# Patient Record
Sex: Female | Born: 1995 | Hispanic: Yes | Marital: Single | Smoking: Never smoker
Health system: Southern US, Community
[De-identification: ages and names within clinical notes are randomized; demographics above are authoritative.]

## PROBLEM LIST (undated history)

## (undated) DIAGNOSIS — F99 Mental disorder, not otherwise specified: Secondary | ICD-10-CM

## (undated) DIAGNOSIS — N946 Dysmenorrhea, unspecified: Secondary | ICD-10-CM

## (undated) DIAGNOSIS — F419 Anxiety disorder, unspecified: Secondary | ICD-10-CM

## (undated) DIAGNOSIS — Z30017 Encounter for initial prescription of implantable subdermal contraceptive: Secondary | ICD-10-CM

## (undated) HISTORY — PX: NO PAST SURGERIES: SHX2092

## (undated) HISTORY — DX: Anxiety disorder, unspecified: F41.9

## (undated) HISTORY — DX: Encounter for initial prescription of implantable subdermal contraceptive: Z30.017

## (undated) HISTORY — DX: Mental disorder, not otherwise specified: F99

---
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
## 2016-08-15 IMAGING — US US OB TRANSVAGINAL
1 series · 14 of 28 positions shown · non-contrast
Comparison: None.

CLINICAL DATA: Pelvic pain, positive pregnancy test

EXAM:
OBSTETRIC <14 WK US AND TRANSVAGINAL OB US
TECHNIQUE: Both transabdominal and transvaginal ultrasound examinations were
performed for complete evaluation of the gestation as well as the
maternal uterus, adnexal regions, and pelvic cul-de-sac.
Transvaginal technique was performed to assess early pregnancy.

[Series 1: us ob transvaginal · 0.18mm/px · 14 of 54 slices shown]
[im 2/54]
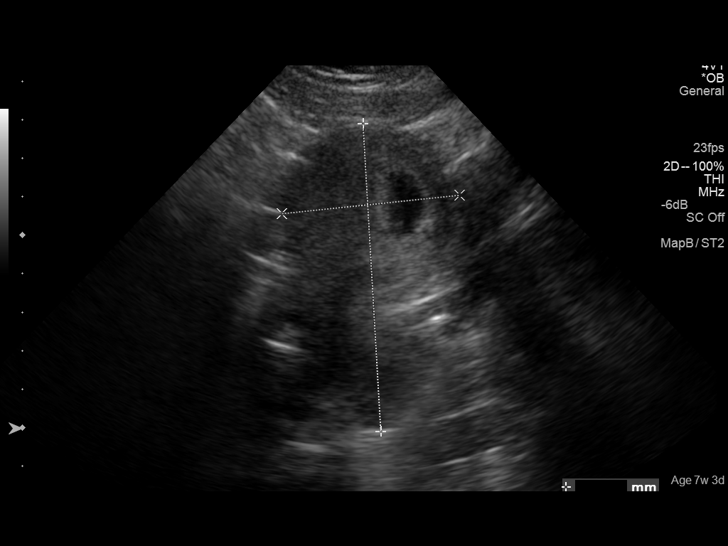
[im 6/54]
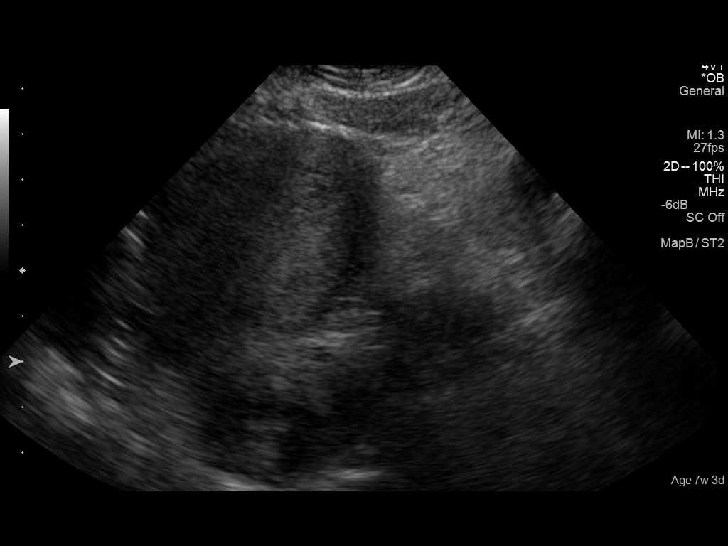
[im 10/54]
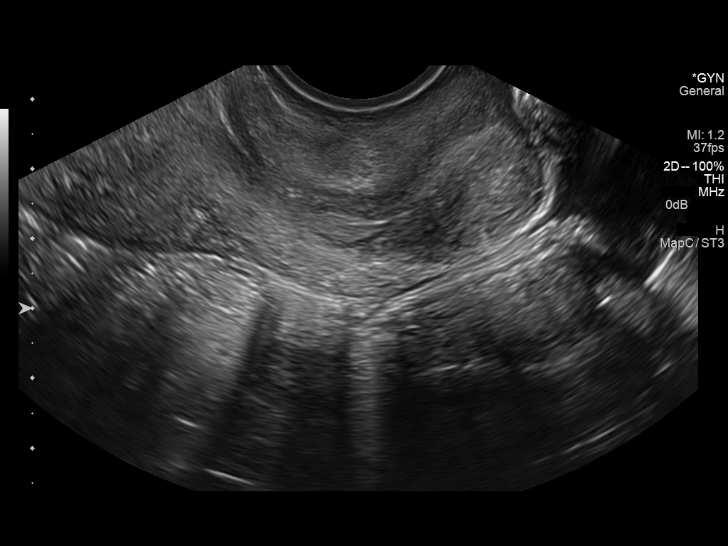
[im 14/54]
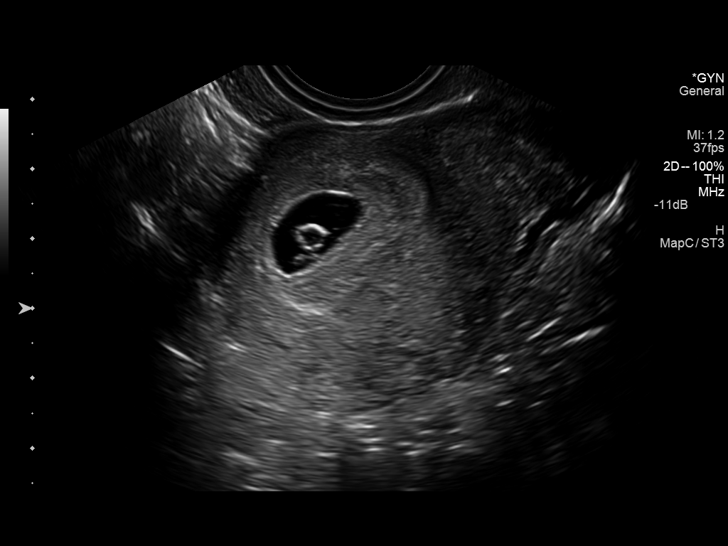
[im 18/54]
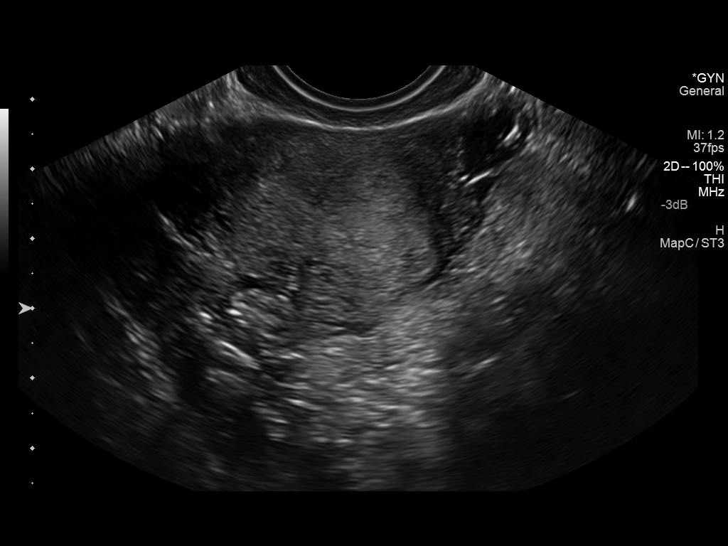
[im 22/54]
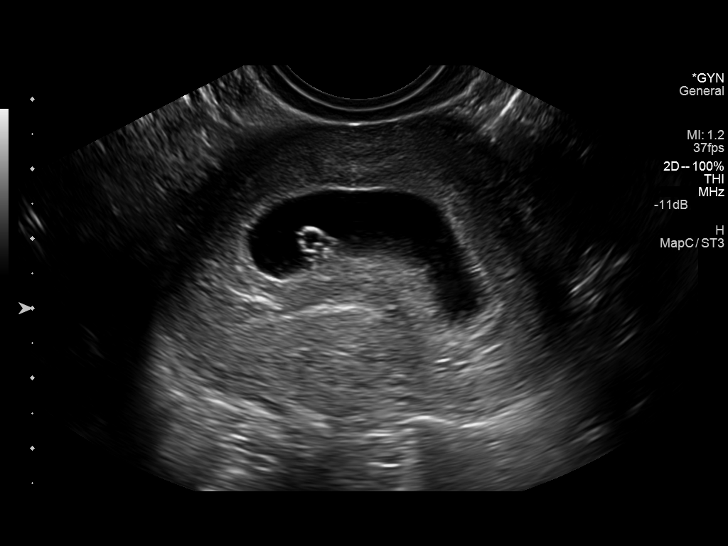
[im 26/54]
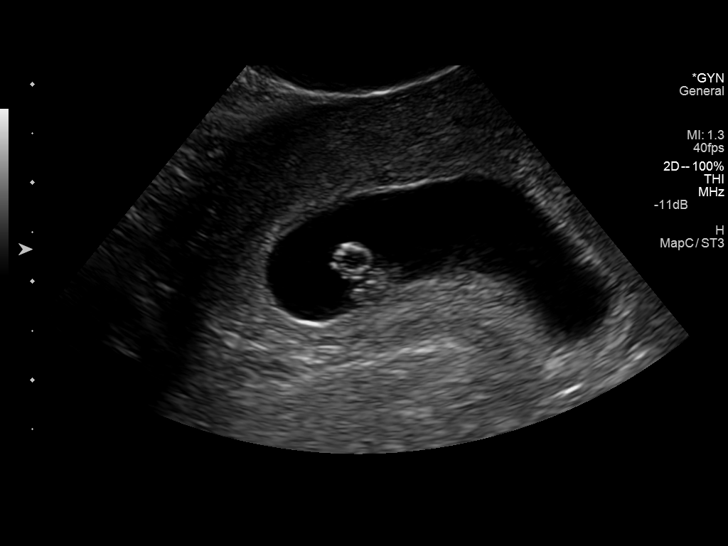
[im 30/54]
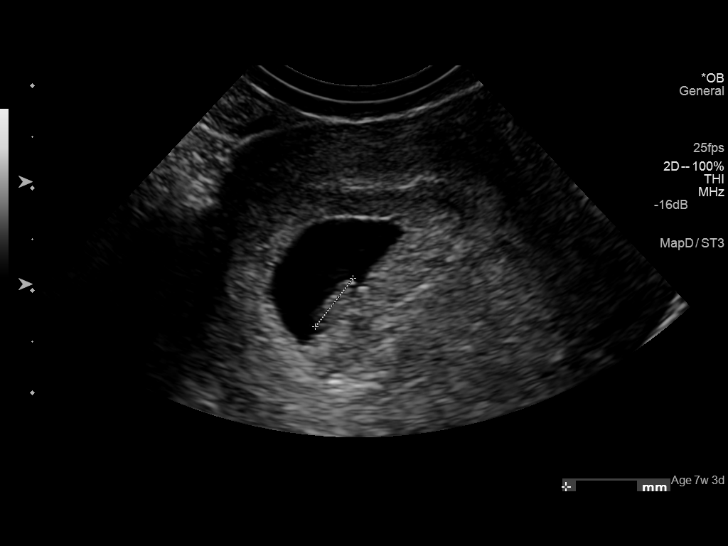
[im 34/54]
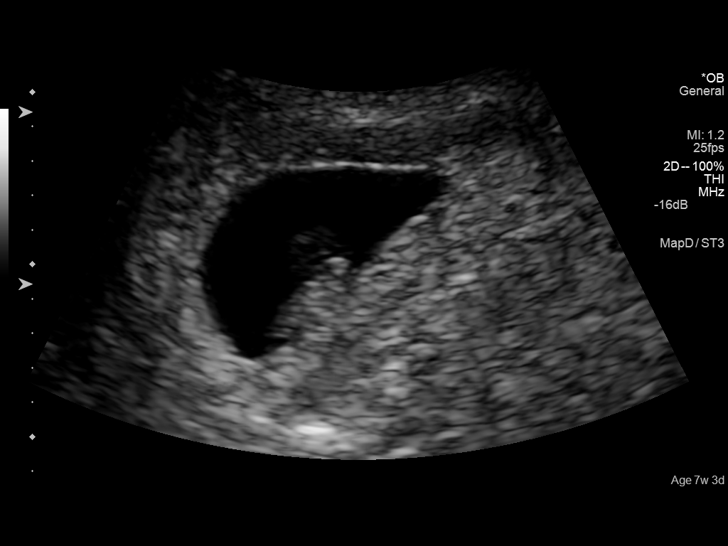
[im 38/54]
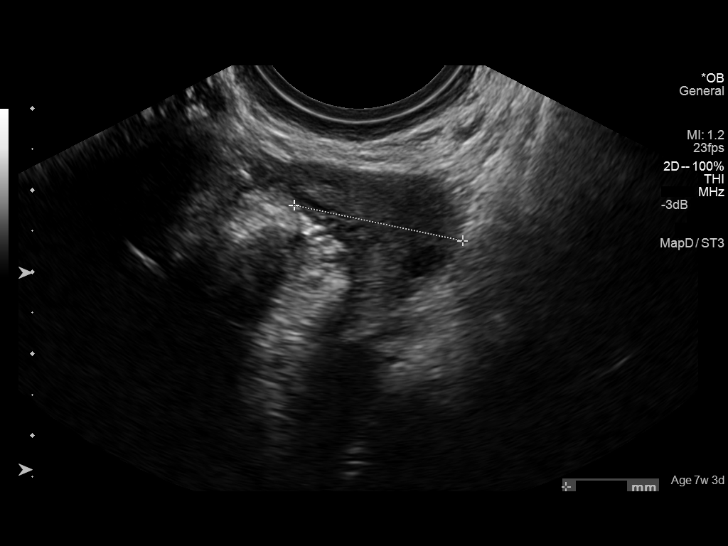
[im 42/54]
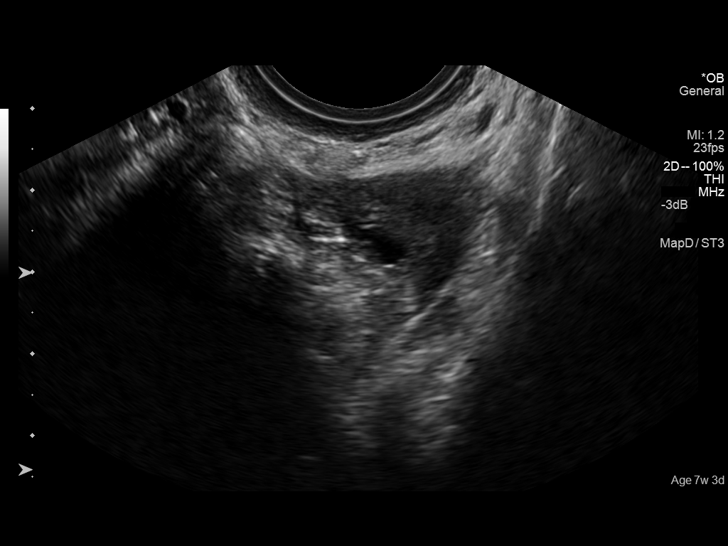
[im 46/54]
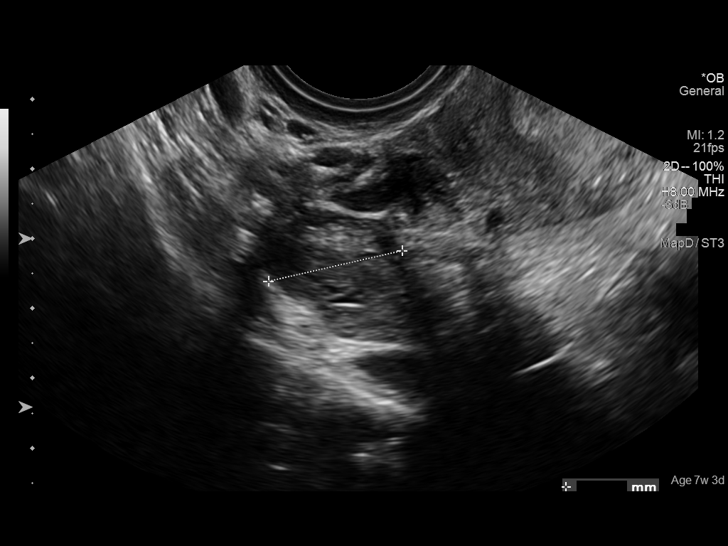
[im 50/54]
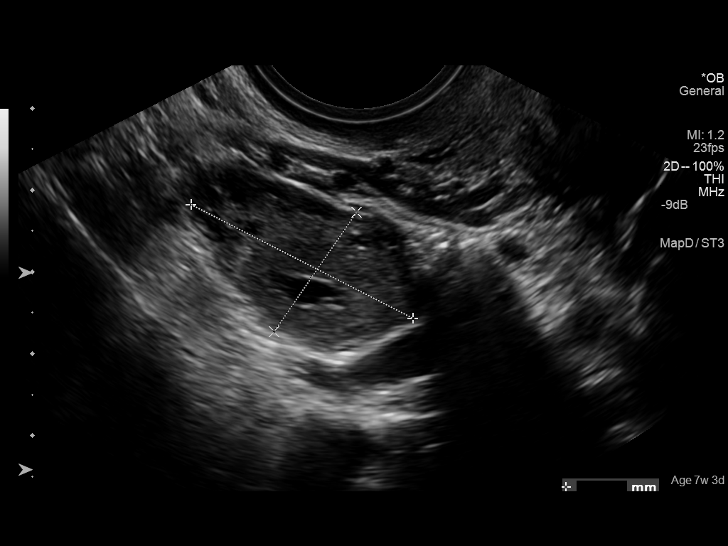
[im 54/54]
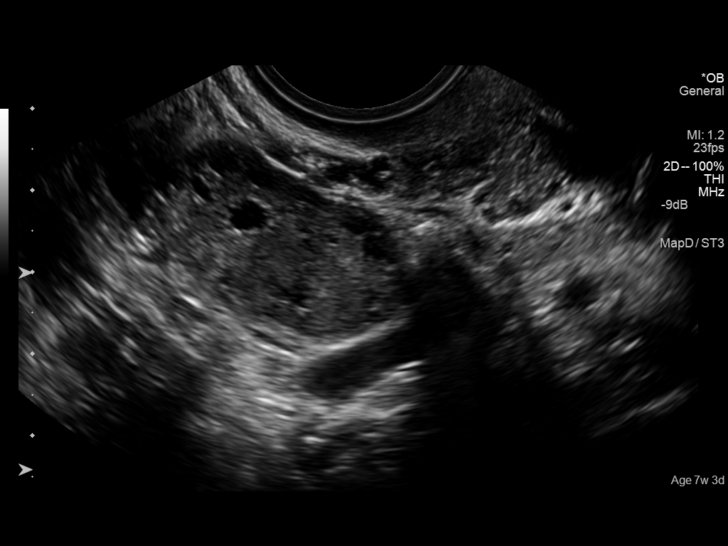

[14 of 28 positions shown; findings below may reference images not displayed]

FINDINGS: Intrauterine gestational sac: Visualized/normal in shape.

Yolk sac:  Present

Embryo:  Present

Cardiac Activity: Present

Heart Rate:  153 bpm

CRL:   5.9 mm  mm   6 w 3 d                  US EDC: 03/25/2015

Maternal uterus/adnexae: Small area of decreased echogenicity is
noted adjacent to the gestational sac consistent with a small
subchorionic hemorrhage. The ovaries are within normal limits.
IMPRESSION: Single live intrauterine gestation at 6 weeks 3 days, small
subchorionic hemorrhage. No other focal abnormality is noted.

## 2016-11-14 ENCOUNTER — Encounter: Payer: Self-pay | Admitting: Nurse Practitioner

## 2016-11-14 ENCOUNTER — Ambulatory Visit (INDEPENDENT_AMBULATORY_CARE_PROVIDER_SITE_OTHER): Payer: Medicaid Other | Admitting: Nurse Practitioner

## 2016-11-14 VITALS — BP 97/65 | HR 71 | Temp 98.0°F | Ht 60.0 in | Wt 141.0 lb

## 2016-11-14 DIAGNOSIS — F331 Major depressive disorder, recurrent, moderate: Secondary | ICD-10-CM | POA: Diagnosis not present

## 2016-11-14 DIAGNOSIS — Z0000 Encounter for general adult medical examination without abnormal findings: Secondary | ICD-10-CM | POA: Diagnosis not present

## 2016-11-14 MED ORDER — ESCITALOPRAM OXALATE 10 MG PO TABS
10.0000 mg | ORAL_TABLET | Freq: Every day | ORAL | 5 refills | Status: DC
Start: 2016-11-14 — End: 2017-06-22

## 2016-11-14 NOTE — Patient Instructions (Signed)

## 2016-11-14 NOTE — Progress Notes (Signed)
   Subjective:    Patient ID: Darlene Adkins, female    DOB: 1995/12/29, 21 y.o.   MRN: [id number]  HPI Patient comes in today for annual physical exam without pap. She is doing well. Is sexually active. Has implanon in arm to prevent pregnency. Only practices safe se sometimes. SHe has no medical problems and is on no meds. She does voice depression. Says she has some anxiety which is better. Depression screen PHQ 2/9 11/14/2016  Decreased Interest 3  Down, Depressed, Hopeless 1  PHQ - 2 Score 4  Altered sleeping 0  Tired, decreased energy 3  Change in appetite 3  Feeling bad or failure about yourself  1  Trouble concentrating 1  Moving slowly or fidgety/restless 0  Suicidal thoughts 0  PHQ-9 Score 12      Review of Systems  Constitutional: Positive for appetite change. Negative for diaphoresis.  Eyes: Negative for pain.  Respiratory: Negative for shortness of breath.   Cardiovascular: Negative for chest pain, palpitations and leg swelling.  Gastrointestinal: Negative for abdominal pain.  Endocrine: Negative for polydipsia.  Skin: Negative for rash.  Neurological: Negative for dizziness, weakness and headaches.  Hematological: Does not bruise/bleed easily.  All other systems reviewed and are negative.      Objective:   Physical Exam  Constitutional: She is oriented to person, place, and time. She appears well-developed and well-nourished.  HENT:  Head: Normocephalic.  Right Ear: Hearing, tympanic membrane, external ear and ear canal normal.  Left Ear: Hearing, tympanic membrane, external ear and ear canal normal.  Nose: Nose normal.  Mouth/Throat: Uvula is midline, oropharynx is clear and moist and mucous membranes are normal.  Eyes: Conjunctivae and EOM are normal. Pupils are equal, round, and reactive to light.  Neck: Normal range of motion. Neck supple. No JVD present. No thyromegaly present.  Cardiovascular: Normal rate, normal heart sounds and intact distal pulses.     No murmur heard. Pulmonary/Chest: Effort normal and breath sounds normal. She has no wheezes. She has no rales.  Abdominal: Soft. Bowel sounds are normal. She exhibits no mass.  Musculoskeletal: Normal range of motion.  Neurological: She is alert and oriented to person, place, and time. She has normal reflexes.  Skin: Skin is warm and dry.  Psychiatric: She has a normal mood and affect. Her behavior is normal. Judgment and thought content normal.   BP 97/65   Pulse 71   Temp 98 F (36.7 C) (Oral)   Ht 5' (1.524 m)   Wt 141 lb (64 kg)   BMI 27.54 kg/m       Assessment & Plan:  1. Annual physical exam Diet and exercise Will need  PAP when turn 22 - CMP14+EGFR - Lipid panel - Thyroid Panel With TSH - CBC with Differential/Platelet  2. Moderate episode of recurrent major depressive disorder [facility]) Stress management Call if have issues with lexapro- side effects discussed - escitalopram (LEXAPRO) 10 MG tablet; Take 1 tablet (10 mg total) by mouth daily.  Dispense: 30 tablet; Refill: [place], FNP

## 2016-11-15 LAB — CMP14+EGFR
A/G RATIO: 1.4 (ref 1.2–2.2)
ALK PHOS: 112 IU/L (ref 39–117)
ALT: 13 IU/L (ref 0–32)
AST: 19 IU/L (ref 0–40)
Albumin: 4 g/dL (ref 3.5–5.5)
BILIRUBIN TOTAL: 0.2 mg/dL (ref 0.0–1.2)
BUN/Creatinine Ratio: 14 (ref 9–23)
BUN: 9 mg/dL (ref 6–20)
CHLORIDE: 102 mmol/L (ref 96–106)
CO2: 22 mmol/L (ref 18–29)
Calcium: 9.4 mg/dL (ref 8.7–10.2)
Creatinine, Ser: 0.63 mg/dL (ref 0.57–1.00)
GFR calc Af Amer: 149 mL/min/{1.73_m2} (ref >59)
GFR calc non Af Amer: 130 mL/min/{1.73_m2} (ref >59)
GLUCOSE: 88 mg/dL (ref 65–99)
Globulin, Total: 2.9 g/dL (ref 1.5–4.5)
POTASSIUM: 4.6 mmol/L (ref 3.5–5.2)
Sodium: 139 mmol/L (ref 134–144)
Total Protein: 6.9 g/dL (ref 6.0–8.5)

## 2016-11-15 LAB — CBC WITH DIFFERENTIAL/PLATELET
BASOS ABS: 0 10*3/uL (ref 0.0–0.2)
Basos: 0 %
EOS (ABSOLUTE): 0.1 10*3/uL (ref 0.0–0.4)
Eos: 1 %
HEMOGLOBIN: 11.5 g/dL (ref 11.1–15.9)
Hematocrit: 35.6 % (ref 34.0–46.6)
IMMATURE GRANS (ABS): 0 10*3/uL (ref 0.0–0.1)
IMMATURE GRANULOCYTES: 0 %
LYMPHS: 29 %
Lymphocytes Absolute: 2.1 10*3/uL (ref 0.7–3.1)
MCH: 26.4 pg — AB (ref 26.6–33.0)
MCHC: 32.3 g/dL (ref 31.5–35.7)
MCV: 82 fL (ref 79–97)
MONOCYTES: 5 %
Monocytes Absolute: 0.4 10*3/uL (ref 0.1–0.9)
NEUTROS ABS: 4.6 10*3/uL (ref 1.4–7.0)
Neutrophils: 65 %
Platelets: 265 10*3/uL (ref 150–379)
RBC: 4.35 x10E6/uL (ref 3.77–5.28)
RDW: 15 % (ref 12.3–15.4)
WBC: 7.2 10*3/uL (ref 3.4–10.8)

## 2016-11-15 LAB — THYROID PANEL WITH TSH
Free Thyroxine Index: 2 (ref 1.2–4.9)
T3 UPTAKE RATIO: 25 % (ref 24–39)
T4, Total: 7.9 ug/dL (ref 4.5–12.0)
TSH: 1.29 u[IU]/mL (ref 0.450–4.500)

## 2016-11-15 LAB — LIPID PANEL
Chol/HDL Ratio: 3.7 ratio (ref 0.0–4.4)
Cholesterol, Total: 148 mg/dL (ref 100–199)
HDL: 40 mg/dL (ref >39)
LDL Calculated: 88 mg/dL (ref 0–99)
TRIGLYCERIDES: 101 mg/dL (ref 0–149)
VLDL CHOLESTEROL CAL: 20 mg/dL (ref 5–40)

## 2017-05-31 DIAGNOSIS — Z23 Encounter for immunization: Secondary | ICD-10-CM | POA: Diagnosis not present

## 2017-06-22 ENCOUNTER — Encounter: Payer: Self-pay | Admitting: Nurse Practitioner

## 2017-06-22 ENCOUNTER — Ambulatory Visit: Payer: Managed Care, Other (non HMO) | Admitting: Nurse Practitioner

## 2017-06-22 VITALS — BP 95/65 | HR 65 | Temp 97.9°F | Ht 60.0 in | Wt 133.0 lb

## 2017-06-22 DIAGNOSIS — F320 Major depressive disorder, single episode, mild: Secondary | ICD-10-CM

## 2017-06-22 MED ORDER — SERTRALINE HCL 50 MG PO TABS: 50.0000 mg | ORAL_TABLET | Freq: Every day | ORAL | 5 refills | Status: DC

## 2017-06-22 NOTE — Progress Notes (Signed)
   Subjective:    Patient ID: Darlene Adkins, female    DOB: 09/16/1995, 21 y.o.   MRN: [id number]  HPI  Patient comes in today to discuss depression. She was seen 11/14/16 with depression and was started on lexapro. She only took it for a month but she says it did not seem to make a difference. She says she gets irritated very easily, loss of appetite, trouble bonding with baby. Her sister is bonding better with child more then she is. She denies suicidal thoughts.  [facility] 06/22/2017 11/14/2016  Decreased Interest 2 3  Down, Depressed, Hopeless 3 1  PHQ - 2 Score 5 4  Altered sleeping 1 0  Tired, decreased energy 2 3  Change in appetite 3 3  Feeling bad or failure about yourself  2 1  Trouble concentrating 2 1  Moving slowly or fidgety/restless 1 0  Suicidal thoughts 0 0  PHQ-9 Score 16 12      Review of Systems  Constitutional: Negative for activity change and appetite change.  HENT: Negative.   Eyes: Negative for pain.  Respiratory: Negative for shortness of breath.   Cardiovascular: Negative for chest pain, palpitations and leg swelling.  Gastrointestinal: Negative for abdominal pain.  Endocrine: Negative for polydipsia.  Genitourinary: Negative.   Skin: Negative for rash.  Neurological: Negative for dizziness, weakness and headaches.  Hematological: Does not bruise/bleed easily.  Psychiatric/Behavioral: Negative.   All other systems reviewed and are negative.      Objective:   Physical Exam  Constitutional: She is oriented to person, place, and time. She appears well-developed and well-nourished. No distress.  Cardiovascular: Normal rate and regular rhythm.  Pulmonary/Chest: Effort normal and breath sounds normal.  Neurological: She is alert and oriented to person, place, and time.  Skin: Skin is warm and dry.  Psychiatric:  Good eye contact Answers all questions appropriatey   BP 95/65   Pulse 65   Temp 97.9 F (36.6 C) (Oral)   Ht 5' (1.524  m)   Wt 133 lb (60.3 kg)   BMI 25.97 kg/m         Assessment & Plan:  1. Depression, major, single episode, mild (HCC) Stress management Have something to look forward to daily Eat 3 balanced meals a day Exercise RTO in 3-4 weeks follow up - sertraline (ZOLOFT) 50 MG tablet; Take 1 tablet (50 mg total) daily by mouth.  Dispense: 30 tablet; Refill: 5  Mary-Margaret Daphine DeutscherMartin, FNP

## 2017-06-22 NOTE — Patient Instructions (Signed)

## 2017-07-06 ENCOUNTER — Encounter: Payer: Self-pay | Admitting: Nurse Practitioner

## 2017-07-06 ENCOUNTER — Ambulatory Visit (INDEPENDENT_AMBULATORY_CARE_PROVIDER_SITE_OTHER): Payer: Worker's Compensation | Admitting: Nurse Practitioner

## 2017-07-06 VITALS — BP 97/72 | HR 71 | Temp 97.4°F | Ht 61.0 in | Wt 130.8 lb

## 2017-07-06 DIAGNOSIS — S20229A Contusion of unspecified back wall of thorax, initial encounter: Secondary | ICD-10-CM | POA: Diagnosis not present

## 2017-07-06 DIAGNOSIS — S20219A Contusion of unspecified front wall of thorax, initial encounter: Secondary | ICD-10-CM

## 2017-07-06 MED ORDER — NAPROXEN 500 MG PO TABS: 500.0000 mg | ORAL_TABLET | Freq: Two times a day (BID) | ORAL | 1 refills | Status: DC

## 2017-07-06 NOTE — Patient Instructions (Signed)
Contusion A contusion is a deep bruise. Contusions happen when an injury causes bleeding under the skin. Symptoms of bruising include pain, swelling, and discolored skin. The skin may turn blue, purple, or yellow. Follow these instructions at home:  Rest the injured area.  If told, put ice on the injured area. ? Put ice in a plastic bag. ? Place a towel between your skin and the bag. ? Leave the ice on for 20 minutes, 2-3 times per day.  If told, put light pressure (compression) on the injured area using an elastic bandage. Make sure the bandage is not too tight. Remove it and put it back on as told by your doctor.  If possible, raise (elevate) the injured area above the level of your heart while you are sitting or lying down.  Take over-the-counter and prescription medicines only as told by your doctor. Contact a doctor if:  Your symptoms do not get better after several days of treatment.  Your symptoms get worse.  You have trouble moving the injured area. Get help right away if:  You have very bad pain.  You have a loss of feeling (numbness) in a hand or foot.  Your hand or foot turns pale or cold. This information is not intended to replace advice given to you by your health care provider. Make sure you discuss any questions you have with your health care provider. Document Released: 01/11/2008 Document Revised: 12/31/2015 Document Reviewed: 12/10/2014 Elsevier Interactive Patient Education  2018 Elsevier Inc.  

## 2017-07-06 NOTE — Progress Notes (Signed)
Duplicate

## 2017-07-06 NOTE — Progress Notes (Signed)
   Subjective:    Patient ID: Hoy MornMirella Pettinato, female    DOB: 06/06/1996, 21 y.o.   MRN: [id number]  HPI Hoy MornMirella Deak comes in today for workers comp injury.  DATE OF INJURY: 07/05/17   EMPLOYER: [employer]  EXPLANATION OF INJURY:  A heavy piece of glass fell over on her knocking her backwards. She hit her back on some "rollers" and the glass landed on her chest. She felt stiff this morning and has bruises on her back. Rates pain right now in back a 7/10. She also said that she had some spotting this morning as well and it is to early for her period. No abdominal cramping.   Review of Systems  Constitutional: Negative.   HENT: Negative.   Respiratory: Negative.   Cardiovascular: Negative.   Genitourinary: Positive for vaginal bleeding (have not seen soince this morning). Negative for dysuria, frequency, hematuria, urgency, vaginal discharge and vaginal pain.  Musculoskeletal: Positive for back pain.  Neurological: Negative.   Psychiatric/Behavioral: Negative.   All other systems reviewed and are negative.      Objective:   Physical Exam  Constitutional: She is oriented to person, place, and time. She appears well-developed and well-nourished. No distress.  Cardiovascular: Normal rate and regular rhythm.  Pulmonary/Chest: Effort normal and breath sounds normal.  Abdominal: Soft. Bowel sounds are normal. She exhibits no distension. There is no tenderness.  Musculoskeletal:  Pain on palpation to anterior chest wall and mid upper back No contusions visible  Neurological: She is alert and oriented to person, place, and time.  Skin: Skin is warm.  Psychiatric: She has a normal mood and affect. Her behavior is normal. Judgment and thought content normal.    BP 97/72   Pulse 71   Temp (!) 97.4 F (36.3 C) (Oral)   Ht 5\' 1"  (1.549 m)   Wt 130 lb 12.8 oz (59.3 kg)   LMP 06/08/2017 (Approximate)   BMI 24.71 kg/m     Assessment & Plan:   1. Contusion of back, unspecified  laterality, initial encounter   2. Contusion of chest wall, unspecified laterality, initial encounter    Ice to chest wall and back Rest RTO prn No work restrictions  Meds ordered this encounter  Medications  . naproxen (NAPROSYN) 500 MG tablet    Sig: Take 1 tablet (500 mg total) by mouth 2 (two) times daily with a meal.    Dispense:  60 tablet    Refill:  1    Order Specific Question:   Supervising Provider    Answer:   Johna SheriffVINCENT, CAROL L [[id number]]   Mary-Margaret Daphine DeutscherMartin, FNP

## 2017-07-13 ENCOUNTER — Encounter: Payer: Self-pay | Admitting: Nurse Practitioner

## 2017-07-13 ENCOUNTER — Ambulatory Visit: Payer: Managed Care, Other (non HMO) | Admitting: Nurse Practitioner

## 2017-07-13 VITALS — BP 93/64 | HR 64 | Temp 97.8°F | Ht 61.0 in | Wt 132.0 lb

## 2017-07-13 DIAGNOSIS — F320 Major depressive disorder, single episode, mild: Secondary | ICD-10-CM

## 2017-07-13 NOTE — Progress Notes (Signed)
   Subjective:    Patient ID: Darlene Adkins, female    DOB: 01/19/1996, 21 y.o.   MRN: [id number]  HPI Patient comes in today for follow up of depression. She was seen on 06/22/17 with depression score of 16. She was started on zoloft 5mg  daily. Patient says she is doing better. Started feeling results a week later. She ays she stays calm, no sadness and has n=more patients with her baby.    Review of Systems  Constitutional: Negative for activity change and appetite change.  HENT: Negative.   Eyes: Negative for pain.  Respiratory: Negative for shortness of breath.   Cardiovascular: Negative for chest pain, palpitations and leg swelling.  Gastrointestinal: Negative for abdominal pain.  Endocrine: Negative for polydipsia.  Genitourinary: Negative.   Skin: Negative for rash.  Neurological: Negative for dizziness, weakness and headaches.  Hematological: Does not bruise/bleed easily.  Psychiatric/Behavioral: Negative.   All other systems reviewed and are negative.      Objective:   Physical Exam  Constitutional: She is oriented to person, place, and time. She appears well-developed and well-nourished.  HENT:  Nose: Nose normal.  Mouth/Throat: Oropharynx is clear and moist.  Eyes: EOM are normal.  Neck: Trachea normal, normal range of motion and full passive range of motion without pain. Neck supple. No JVD present. Carotid bruit is not present. No thyromegaly present.  Cardiovascular: Normal rate, regular rhythm, normal heart sounds and intact distal pulses. Exam reveals no gallop and no friction rub.  No murmur heard. Pulmonary/Chest: Effort normal and breath sounds normal.  Abdominal: Soft. Bowel sounds are normal. She exhibits no distension and no mass. There is no tenderness.  Lymphadenopathy:    She has no cervical adenopathy.  Neurological: She is alert and oriented to person, place, and time. She has normal reflexes.  Skin: Skin is warm and dry.  Psychiatric: She has a  normal mood and affect. Her behavior is normal. Judgment and thought content normal.   BP 93/64   Pulse 64   Temp 97.8 F (36.6 C) (Oral)   Ht 5\' 1"  (1.549 m)   Wt 132 lb (59.9 kg)   BMI 24.94 kg/m        Assessment & Plan:  1. Depression, major, single episode, mild (HCC)  Continue zoloft as rx Stress management RTO prn  Mary-Margaret Daphine DeutscherMartin, FNP

## 2017-07-13 NOTE — Patient Instructions (Signed)
Stress and Stress Management Stress is a normal reaction to life events. It is what you feel when life demands more than you are used to or more than you can handle. Some stress can be useful. For example, the stress reaction can help you catch the last bus of the day, study for a test, or meet a deadline at work. But stress that occurs too often or for too long can cause problems. It can affect your emotional health and interfere with relationships and normal daily activities. Too much stress can weaken your immune system and increase your risk for physical illness. If you already have a medical problem, stress can make it worse. What are the causes? All sorts of life events may cause stress. An event that causes stress for one person may not be stressful for another person. Major life events commonly cause stress. These may be positive or negative. Examples include losing your job, moving into a new home, getting married, having a baby, or losing a loved one. Less obvious life events may also cause stress, especially if they occur day after day or in combination. Examples include working long hours, driving in traffic, caring for children, being in debt, or being in a difficult relationship. What are the signs or symptoms? Stress may cause emotional symptoms including, the following:  Anxiety. This is feeling worried, afraid, on edge, overwhelmed, or out of control.  Anger. This is feeling irritated or impatient.  Depression. This is feeling sad, down, helpless, or guilty.  Difficulty focusing, remembering, or making decisions.  Stress may cause physical symptoms, including the following:  Aches and pains. These may affect your head, neck, back, stomach, or other areas of your body.  Tight muscles or clenched jaw.  Low energy or trouble sleeping.  Stress may cause unhealthy behaviors, including the following:  Eating to feel better (overeating) or skipping meals.  Sleeping too little,  too much, or both.  Working too much or putting off tasks (procrastination).  Smoking, drinking alcohol, or using drugs to feel better.  How is this diagnosed? Stress is diagnosed through an assessment by your health care provider. Your health care provider will ask questions about your symptoms and any stressful life events.Your health care provider will also ask about your medical history and may order blood tests or other tests. Certain medical conditions and medicine can cause physical symptoms similar to stress. Mental illness can cause emotional symptoms and unhealthy behaviors similar to stress. Your health care provider may refer you to a mental health professional for further evaluation. How is this treated? Stress management is the recommended treatment for stress.The goals of stress management are reducing stressful life events and coping with stress in healthy ways. Techniques for reducing stressful life events include the following:  Stress identification. Self-monitor for stress and identify what causes stress for you. These skills may help you to avoid some stressful events.  Time management. Set your priorities, keep a calendar of events, and learn to say "no." These tools can help you avoid making too many commitments.  Techniques for coping with stress include the following:  Rethinking the problem. Try to think realistically about stressful events rather than ignoring them or overreacting. Try to find the positives in a stressful situation rather than focusing on the negatives.  Exercise. Physical exercise can release both physical and emotional tension. The key is to find a form of exercise you enjoy and do it regularly.  Relaxation techniques. These relax the body and  mind. Examples include yoga, meditation, tai chi, biofeedback, deep breathing, progressive muscle relaxation, listening to music, being out in nature, journaling, and other hobbies. Again, the key is to find  one or more that you enjoy and can do regularly.  Healthy lifestyle. Eat a balanced diet, get plenty of sleep, and do not smoke. Avoid using alcohol or drugs to relax.  Strong support network. Spend time with family, friends, or other people you enjoy being around.Express your feelings and talk things over with someone you trust.  Counseling or talktherapy with a mental health professional may be helpful if you are having difficulty managing stress on your own. Medicine is typically not recommended for the treatment of stress.Talk to your health care provider if you think you need medicine for symptoms of stress. Follow these instructions at home:  Keep all follow-up visits as directed by your health care provider.  Take all medicines as directed by your health care provider. Contact a health care provider if:  Your symptoms get worse or you start having new symptoms.  You feel overwhelmed by your problems and can no longer manage them on your own. Get help right away if:  You feel like hurting yourself or someone else. This information is not intended to replace advice given to you by your health care provider. Make sure you discuss any questions you have with your health care provider. Document Released: 01/18/2001 Document Revised: 12/31/2015 Document Reviewed: 03/19/2013 Elsevier Interactive Patient Education  2017 [employer] Inc.  

## 2018-01-04 ENCOUNTER — Encounter: Payer: Self-pay | Admitting: Family Medicine

## 2018-01-04 ENCOUNTER — Ambulatory Visit: Payer: Managed Care, Other (non HMO) | Admitting: Family Medicine

## 2018-01-04 VITALS — BP 96/64 | HR 88 | Temp 100.4°F | Ht 61.0 in | Wt 133.0 lb

## 2018-01-04 DIAGNOSIS — J029 Acute pharyngitis, unspecified: Secondary | ICD-10-CM | POA: Diagnosis not present

## 2018-01-04 LAB — RAPID STREP SCREEN (MED CTR MEBANE ONLY): Strep Gp A Ag, IA W/Reflex: NEGATIVE

## 2018-01-04 LAB — CULTURE, GROUP A STREP

## 2018-01-04 NOTE — Progress Notes (Signed)
BP 96/64   Pulse 88   Temp (!) 100.4 F (38 C) (Oral)   Ht  (1.549 m)   Wt 133 lb (60.3 kg)   BMI 25.13 kg/m    Subjective:    Patient ID: Darlene Adkins, female    DOB: March 08, 1996, 22 y.o.   MRN: [id number]  HPI: Patient is a 22yr old female who presents to the office today with 1 day history of sore throat and low grade fever. Patient reports her throat began hurting at work yesterday, and describes it as "scratchy and sore" when she swallows. She reports decreased appetite only because it hurts to swallow. She has not taken any medications or attempted any home remedies to alleviate her symptoms. She denies a fever at home, but presents with a low grade fever of 100.4 in office today. She denies NVD, constipation, muscle aches or neck stiffness.   Patient denies sick contacts.  Relevant past medical, surgical, family and social history reviewed and updated as indicated. Interim medical history since our last visit reviewed. Allergies and medications reviewed and updated.  Review of Systems  Constitutional: Positive for appetite change. Negative for chills and fever.       Patient reports decreased appetite due to pain on swallowing.  HENT: Positive for congestion, sore throat and trouble swallowing. Negative for ear pain, rhinorrhea and sinus pressure.        Patient reports pain on swallowing.  Eyes: Negative.   Respiratory: Negative.   Cardiovascular: Negative.   Gastrointestinal: Negative for diarrhea, nausea and vomiting.  Musculoskeletal: Negative for myalgias.  Neurological: Positive for headaches.      Per HPI unless specifically indicated above   Allergies as of 01/04/2018   No Known Allergies     Medication List        Accurate as of 01/04/18  3:07 PM. Always use your most recent med list.          NEXPLANON 68 MG Impl implant Generic drug:  etonogestrel 1 each by Subdermal route once.   sertraline 50 MG tablet Commonly known as:  ZOLOFT Take 1  tablet (50 mg total) daily by mouth.          Objective:    BP 96/64   Pulse 88   Temp (!) 100.4 F (38 C) (Oral)   Ht  (1.549 m)   Wt 133 lb (60.3 kg)   BMI 25.13 kg/m   Wt Readings from Last 3 Encounters:  01/04/18 133 lb (60.3 kg)  07/13/17 132 lb (59.9 kg)  07/06/17 130 lb 12.8 oz (59.3 kg)    Physical Exam  Constitutional: She is oriented to person, place, and time. She appears well-developed and well-nourished. No distress.  HENT:  Head: Normocephalic.  Right Ear: External ear normal.  Left Ear: External ear normal.  Throat is erythematous, with bilateral tonsillar enlargement and left tonsillar ulceration.   Eyes: Conjunctivae are normal.  Cardiovascular: Normal rate, regular rhythm and normal heart sounds.  Pulmonary/Chest: Effort normal and breath sounds normal. She has no wheezes.  Abdominal: Soft. She exhibits no distension. There is no tenderness.  Lymphadenopathy:    She has cervical adenopathy.  Neurological: She is alert and oriented to person, place, and time.  Skin: Skin is warm and dry.  Psychiatric: She has a normal mood and affect.      Assessment & Plan:  Patient presents to the clinic today with 1 day history of sore throat, pain on swallowing and  low grade fever. She denies sick contacts and reports no significant history of strep or tonsillitis. Patient's rapid strep test was negative, so I have ordered a strep throat culture to further assess. Given the appearance of her throat on physical exam and considering she is sexually active, I have ordered a viral throat culture to rule out a herpes infection.   I will await the results of the throat culture to determine a more definitive diagnosis and treatment plan. In the meantime, I have advised the patient to take Motrin or Ibuprofen and gargle with warm salt water to help soothe her throat. Patient reports having Allegra at home, so I encouraged her to take this to help with inflammation.     Problem List Items Addressed This Visit    None    Visit Diagnoses    Acute pharyngitis, unspecified etiology    -  Primary   Relevant Orders   Rapid Strep Screen ([facility] & [facility] ONLY) (Completed)   Culture, Group A Strep (Completed)   Herpes simplex virus culture (Completed)       Follow up plan: I will contact the patient with the results of her throat cultures in the next couple of days and adjust management as needed.  Celedonio Miyamoto, PA-S   Counseling provided for all of the vaccine components Orders Placed This Encounter  Procedures  . Rapid Strep Screen ([facility] & [facility] ONLY)   Patient seen and examined with Liana Crocker, PA student, agree with assessment and plan above.  Will run both cultures to see what they come back as and treat conservatively like a viral pharyngitis for now. Arville Care, MD [facility] 01/04/2018, 3:07 PM

## 2018-01-06 LAB — CULTURE, GROUP A STREP: Strep A Culture: NEGATIVE

## 2018-01-08 LAB — HERPES SIMPLEX VIRUS CULTURE

## 2018-06-06 ENCOUNTER — Other Ambulatory Visit: Payer: Self-pay

## 2018-06-06 ENCOUNTER — Encounter: Payer: Self-pay | Admitting: Women's Health

## 2018-06-06 ENCOUNTER — Other Ambulatory Visit (HOSPITAL_COMMUNITY)
Admission: RE | Admit: 2018-06-06 | Discharge: 2018-06-06 | Disposition: A | Discharge status: Home / Self Care | Payer: Managed Care, Other (non HMO) | Source: Ambulatory Visit | Attending: Obstetrics & Gynecology | Admitting: Obstetrics & Gynecology

## 2018-06-06 ENCOUNTER — Ambulatory Visit (INDEPENDENT_AMBULATORY_CARE_PROVIDER_SITE_OTHER): Payer: Managed Care, Other (non HMO) | Admitting: Women's Health

## 2018-06-06 VITALS — BP 99/64 | HR 63 | Ht 60.0 in | Wt 133.0 lb

## 2018-06-06 DIAGNOSIS — Z01411 Encounter for gynecological examination (general) (routine) with abnormal findings: Secondary | ICD-10-CM | POA: Diagnosis not present

## 2018-06-06 DIAGNOSIS — B9689 Other specified bacterial agents as the cause of diseases classified elsewhere: Secondary | ICD-10-CM | POA: Diagnosis not present

## 2018-06-06 DIAGNOSIS — N760 Acute vaginitis: Secondary | ICD-10-CM | POA: Diagnosis not present

## 2018-06-06 DIAGNOSIS — Z113 Encounter for screening for infections with a predominantly sexual mode of transmission: Secondary | ICD-10-CM

## 2018-06-06 DIAGNOSIS — Z3009 Encounter for other general counseling and advice on contraception: Secondary | ICD-10-CM

## 2018-06-06 DIAGNOSIS — N898 Other specified noninflammatory disorders of vagina: Secondary | ICD-10-CM

## 2018-06-06 DIAGNOSIS — Z01419 Encounter for gynecological examination (general) (routine) without abnormal findings: Secondary | ICD-10-CM | POA: Diagnosis not present

## 2018-06-06 LAB — POCT WET PREP (WET MOUNT)
Clue Cells Wet Prep Whiff POC: POSITIVE
Trichomonas Wet Prep HPF POC: ABSENT

## 2018-06-06 MED ORDER — METRONIDAZOLE 500 MG PO TABS: 500.0000 mg | ORAL_TABLET | Freq: Two times a day (BID) | ORAL | 0 refills | Status: DC

## 2018-06-06 NOTE — Progress Notes (Signed)
WELL-WOMAN EXAMINATION Patient name: Darlene Adkins MRN [id number]  Date of birth: 1995-09-26 Chief Complaint:   Gynecologic Exam  History of Present Illness:   Darlene Adkins is a 22 y.o. G76P1001 Hispanic female being seen today for a routine well-woman exam.  Current complaints: needs nexplanon removed/new one inserted. Increased vag d/c, hasn't noticed odor/itching/irritation. Bump on Lt side of labia majora 'for awhile', no changes  PCP: [facility]      does not desire labs, other than required by [facility] Patient's last menstrual period was 05/16/2018 (approximate). The current method of family planning is nexplanon placed 05/11/15 Last pap never. Results were: n/a Last mammogram: never. Results were: n/a Last colonoscopy: never. Results were: n/a  Review of Systems:   Pertinent items are noted in HPI Denies any headaches, blurred vision, fatigue, shortness of breath, chest pain, abdominal pain, abnormal vaginal discharge/itching/odor/irritation, problems with periods, bowel movements, urination, or intercourse unless otherwise stated above. Pertinent History Reviewed:  Reviewed past medical,surgical, social and family history.  Reviewed problem list, medications and allergies. Physical Assessment:   Vitals:   06/06/18 1059  BP: 99/64  Pulse: 63  Weight: 133 lb (60.3 kg)  Height: 5' (1.524 m)  Body mass index is 25.97 kg/m.        Physical Examination:   General appearance - well appearing, and in no distress  Mental status - alert, oriented to person, place, and time  Psych:  She has a normal mood and affect  Skin - warm and dry, normal color, no suspicious lesions noted  Chest - effort normal, all lung fields clear to auscultation bilaterally  Heart - normal rate and regular rhythm  Neck:  midline trachea, no thyromegaly or nodules  Breasts - breasts appear normal, no suspicious masses, no skin or nipple changes or  axillary nodes  Abdomen - soft, nontender, nondistended,  no masses or organomegaly  Pelvic - VULVA: normal appearing vulva with no masses, tenderness, small sebaceous cyst Lt labia majora  VAGINA: normal appearing vagina with normal color and thin white malodorous discharge, no lesions  CERVIX: normal appearing cervix without discharge or lesions, no CMT  Thin prep pap is done w/ HR HPV cotesting  UTERUS: uterus is felt to be normal size, shape, consistency and nontender   ADNEXA: No adnexal masses or tenderness noted.  Extremities:  No swelling or varicosities noted  Results for orders placed or performed in visit on 06/06/18 (from the past 24 hour(s))  POCT Wet Prep [facility])   Collection Time: 06/06/18 11:25 AM  Result Value Ref Range   Source Wet Prep POC vaginal    WBC, Wet Prep HPF POC many    Bacteria Wet Prep HPF POC Few Few   BACTERIA WET PREP MORPHOLOGY POC     Clue Cells Wet Prep HPF POC Moderate (A) None   Clue Cells Wet Prep Whiff POC Positive Whiff    Yeast Wet Prep HPF POC None None   KOH Wet Prep POC     Trichomonas Wet Prep HPF POC Absent Absent    Assessment & Plan:  1) Well-Woman Exam  2) BV> Rx metronidazole 500mg  BID x 7d for BV, no sex or etoh while taking   3) STD screen  4) Past due  5) Needs nexplanon removed/replaced  Labs/procedures today: pap w/ gc/ct, hiv, rpr, wet prep  Mammogram @21yo  or sooner if problems Colonoscopy @21yo  or sooner if problems  Orders Placed This Encounter  Procedures  . RPR  .  HIV Antibody (routine testing w rflx)  . POCT Wet Prep [facility])    Follow-up: Return for 3wks for nexplanon removal/reinsertion, order today .  Cheral Marker CNM, [facility] 06/06/2018 11:34 AM

## 2018-06-06 NOTE — Patient Instructions (Signed)
Bacterial Vaginosis Bacterial vaginosis is a vaginal infection that occurs when the normal balance of bacteria in the vagina is disrupted. It results from an overgrowth of certain bacteria. This is the most common vaginal infection among women ages 15-44. Because bacterial vaginosis increases your risk for STIs (sexually transmitted infections), getting treated can help reduce your risk for chlamydia, gonorrhea, herpes, and HIV (human immunodeficiency virus). Treatment is also important for preventing complications in pregnant women, because this condition can cause an early (premature) delivery. What are the causes? This condition is caused by an increase in harmful bacteria that are normally present in small amounts in the vagina. However, the reason that the condition develops is not fully understood. What increases the risk? The following factors may make you more likely to develop this condition:  Having a new sexual partner or multiple sexual partners.  Having unprotected sex.  Douching.  Having an intrauterine device (IUD).  Smoking.  Drug and alcohol abuse.  Taking certain antibiotic medicines.  Being pregnant.  You cannot get bacterial vaginosis from toilet seats, bedding, swimming pools, or contact with objects around you. What are the signs or symptoms? Symptoms of this condition include:  Grey or white vaginal discharge. The discharge can also be watery or foamy.  A fish-like odor with discharge, especially after sexual intercourse or during menstruation.  Itching in and around the vagina.  Burning or pain with urination.  Some women with bacterial vaginosis have no signs or symptoms. How is this diagnosed? This condition is diagnosed based on:  Your medical history.  A physical exam of the vagina.  Testing a sample of vaginal fluid under a microscope to look for a large amount of bad bacteria or abnormal cells. Your health care provider may use a cotton swab  or a small wooden spatula to collect the sample.  How is this treated? This condition is treated with antibiotics. These may be given as a pill, a vaginal cream, or a medicine that is put into the vagina (suppository). If the condition comes back after treatment, a second round of antibiotics may be needed. Follow these instructions at home: Medicines  Take over-the-counter and prescription medicines only as told by your health care provider.  Take or use your antibiotic as told by your health care provider. Do not stop taking or using the antibiotic even if you start to feel better. General instructions  If you have a female sexual partner, tell her that you have a vaginal infection. She should see her health care provider and be treated if she has symptoms. If you have a female sexual partner, he does not need treatment.  During treatment: ? Avoid sexual activity until you finish treatment. ? Do not douche. ? Avoid alcohol as directed by your health care provider. ? Avoid breastfeeding as directed by your health care provider.  Drink enough water and fluids to keep your urine clear or pale yellow.  Keep the area around your vagina and rectum clean. ? Wash the area daily with warm water. ? Wipe yourself from front to back after using the toilet.  Keep all follow-up visits as told by your health care provider. This is important. How is this prevented?  Do not douche.  Wash the outside of your vagina with warm water only.  Use protection when having sex. This includes latex condoms and dental dams.  Limit how many sexual partners you have. To help prevent bacterial vaginosis, it is best to have sex with just   one partner (monogamous).  Make sure you and your sexual partner are tested for STIs.  Wear cotton or cotton-lined underwear.  Avoid wearing tight pants and pantyhose, especially during summer.  Limit the amount of alcohol that you drink.  Do not use any products that  contain nicotine or tobacco, such as cigarettes and e-cigarettes. If you need help quitting, ask your health care provider.  Do not use illegal drugs. Where to find more information:  Centers for Disease Control and Prevention: www.cdc.gov/std  American Sexual Health Association (ASHA): www.ashastd.org  U.S. Department of Health and Human Services, Office on Women's Health: www.womenshealth.gov/ or https://www.womenshealth.gov/a-z-topics/bacterial-vaginosis Contact a health care provider if:  Your symptoms do not improve, even after treatment.  You have more discharge or pain when urinating.  You have a fever.  You have pain in your abdomen.  You have pain during sex.  You have vaginal bleeding between periods. Summary  Bacterial vaginosis is a vaginal infection that occurs when the normal balance of bacteria in the vagina is disrupted.  Because bacterial vaginosis increases your risk for STIs (sexually transmitted infections), getting treated can help reduce your risk for chlamydia, gonorrhea, herpes, and HIV (human immunodeficiency virus). Treatment is also important for preventing complications in pregnant women, because the condition can cause an early (premature) delivery.  This condition is treated with antibiotic medicines. These may be given as a pill, a vaginal cream, or a medicine that is put into the vagina (suppository). This information is not intended to replace advice given to you by your health care provider. Make sure you discuss any questions you have with your health care provider. Document Released: 07/25/2005 Document Revised: 11/28/2016 Document Reviewed: 04/09/2016 Elsevier Interactive Patient Education  2018 Elsevier Inc.  

## 2018-06-07 LAB — CYTOLOGY - PAP
Chlamydia: NEGATIVE
DIAGNOSIS: NEGATIVE
Neisseria Gonorrhea: NEGATIVE

## 2018-06-07 LAB — HIV ANTIBODY (ROUTINE TESTING W REFLEX): HIV Screen 4th Generation wRfx: NONREACTIVE

## 2018-06-07 LAB — RPR: RPR Ser Ql: NONREACTIVE

## 2018-06-14 ENCOUNTER — Encounter: Payer: Self-pay | Admitting: Women's Health

## 2018-06-28 ENCOUNTER — Encounter: Payer: Self-pay | Admitting: Women's Health

## 2018-06-28 ENCOUNTER — Ambulatory Visit (INDEPENDENT_AMBULATORY_CARE_PROVIDER_SITE_OTHER): Payer: Managed Care, Other (non HMO) | Admitting: Women's Health

## 2018-06-28 VITALS — BP 96/65 | HR 67 | Ht 60.0 in | Wt 130.0 lb

## 2018-06-28 DIAGNOSIS — Z3049 Encounter for surveillance of other contraceptives: Secondary | ICD-10-CM | POA: Diagnosis not present

## 2018-06-28 DIAGNOSIS — Z3202 Encounter for pregnancy test, result negative: Secondary | ICD-10-CM

## 2018-06-28 DIAGNOSIS — Z30017 Encounter for initial prescription of implantable subdermal contraceptive: Secondary | ICD-10-CM

## 2018-06-28 DIAGNOSIS — Z3046 Encounter for surveillance of implantable subdermal contraceptive: Secondary | ICD-10-CM

## 2018-06-28 LAB — POCT URINE PREGNANCY: Preg Test, Ur: NEGATIVE

## 2018-06-28 MED ORDER — ETONOGESTREL 68 MG ~~LOC~~ IMPL
68.0000 mg | DRUG_IMPLANT | Freq: Once | SUBCUTANEOUS | Status: AC
  Administered 2018-06-28: 68 mg via SUBCUTANEOUS

## 2018-06-28 NOTE — Addendum Note (Signed)
Addended by: Sherre LainASH, AMANDA A on: 06/28/2018 02:13 PM   Modules accepted: Orders

## 2018-06-28 NOTE — Progress Notes (Signed)
   NEXPLANON REMOVAL AND RE-INSERTION Patient name: Darlene Adkins MRN [id number]  Date of birth: 07/30/1996 Subjective Findings:   Darlene Adkins is a 22 y.o. 381P1001 Hispanic female being seen today for Nexplanon removal and re-insertion. Her Nexplanon was placed 05/11/15.   Patient's last menstrual period was 06/07/2018. Last pap10/30/19. Results were:  normal  Risks/benefits/side effects of Nexplanon have been discussed and her questions have been answered.  Specifically, a failure rate of 08/998 has been reported, with an increased failure rate if pt takes St. John's Wort and/or antiseizure medicaitons.  She is aware of the common side effect of irregular bleeding, which the incidence of decreases over time. Signed copy of informed consent in chart.  Pertinent History Reviewed:   Reviewed past medical,surgical, social, obstetrical and family history.  Reviewed problem list, medications and allergies. Objective Findings & Procedure:    Vitals:   06/28/18 1202  BP: 96/65  Pulse: 67  Weight: 130 lb (59 kg)  Height: 5' (1.524 m)  Body mass index is 25.39 kg/m.  Results for orders placed or performed in visit on 06/28/18 (from the past 24 hour(s))  POCT urine pregnancy   Collection Time: 06/28/18 12:04 PM  Result Value Ref Range   Preg Test, Ur Negative Negative     Time out was performed.  Nexplanon site identified.  Area prepped in usual sterile fashon. Two cc's of 2% lidocaine was used to anesthetize the area. A small stab incision was made right beside the implant on the distal portion.  The Nexplanon rod was grasped using hemostats and removed intact without difficulty.  The area was cleansed again with betadine and the Nexplanon was inserted approximately 10cm from the medial epicondyle and 3-5cm posterior to the sulcus per manufacturer's recommendations without difficulty.  Steri-strips and a pressure bandage was applied.  There was less than 3 cc blood loss. There were no  complications.  The patient tolerated the procedure well. Assessment & Plan:   1) Nexplanon removal & re-insertion She was instructed to keep the area clean and dry, remove pressure bandage in 24 hours, and keep insertion site covered with the steri-strips for 3-5 days.  She was given a card indicating date Nexplanon was inserted and date it needs to be removed.  Follow-up PRN problems.  Orders Placed This Encounter  Procedures  . POCT urine pregnancy    Follow-up: Return for after 10/30 for , Physical.  Cheral MarkerKimberly R Arrion Burruel CNM, [facility]NP-BC 06/28/2018 12:09 PM

## 2018-06-28 NOTE — Patient Instructions (Signed)

## 2019-02-12 ENCOUNTER — Telehealth: Payer: Self-pay | Admitting: Family

## 2019-02-12 DIAGNOSIS — L710 Perioral dermatitis: Secondary | ICD-10-CM

## 2019-02-12 MED ORDER — CLINDAMYCIN PHOSPHATE 1 % EX GEL: Freq: Two times a day (BID) | CUTANEOUS | 0 refills | Status: DC

## 2019-02-12 NOTE — Progress Notes (Signed)
E Visit for Rash  We are sorry that you are not feeling well. Here is how we plan to help!    Topical clindamycin to the area twice a day   HOME CARE:   Take cool showers and avoid direct sunlight.  Apply cool compress or wet dressings.  Take a bath in an oatmeal bath.  Sprinkle content of one Aveeno packet under running faucet with comfortably warm water.  Bathe for 15-20 minutes, 1-2 times daily.  Pat dry with a towel. Do not rub the rash.  Use hydrocortisone cream.  Take an antihistamine like Benadryl for widespread rashes that itch.  The adult dose of Benadryl is 25-50 mg by mouth 4 times daily.  Caution:  This type of medication may cause sleepiness.  Do not drink alcohol, drive, or operate dangerous machinery while taking antihistamines.  Do not take these medications if you have prostate enlargement.  Read package instructions thoroughly on all medications that you take.  GET HELP RIGHT AWAY IF:   Symptoms don't go away after treatment.  Severe itching that persists.  If you rash spreads or swells.  If you rash begins to smell.  If it blisters and opens or develops a yellow-brown crust.  You develop a fever.  You have a sore throat.  You become short of breath.  MAKE SURE YOU:  Understand these instructions. Will watch your condition. Will get help right away if you are not doing well or get worse.  Thank you for choosing an e-visit. Your e-visit answers were reviewed by a board certified advanced clinical practitioner to complete your personal care plan. Depending upon the condition, your plan could have included both over the counter or prescription medications. Please review your pharmacy choice. Be sure that the pharmacy you have chosen is open so that you can pick up your prescription now.  If there is a problem you may message your provider in [place] to have the prescription routed to another pharmacy. Your safety is important to [place]. If you have drug  allergies check your prescription carefully.  For the next 24 hours, you can use MyChart to ask questions about today's visit, request a non-urgent call back, or ask for a work or school excuse from your e-visit provider. You will get an email in the next two days asking about your experience. I hope that your e-visit has been valuable and will speed your recovery.    Greater than 5 minutes, yet less than 10 minutes of time have been spent researching, coordinating, and implementing care for this patient today.  Thank you for the details you included in the comment boxes. Those details are very helpful in determining the best course of treatment for you and help [place] to provide the best care.

## 2019-06-24 ENCOUNTER — Other Ambulatory Visit: Payer: Self-pay

## 2019-06-25 ENCOUNTER — Ambulatory Visit (INDEPENDENT_AMBULATORY_CARE_PROVIDER_SITE_OTHER): Payer: Managed Care, Other (non HMO) | Admitting: Nurse Practitioner

## 2019-06-25 ENCOUNTER — Other Ambulatory Visit: Payer: Self-pay

## 2019-06-25 ENCOUNTER — Encounter: Payer: Self-pay | Admitting: Nurse Practitioner

## 2019-06-25 VITALS — BP 104/64 | HR 66 | Temp 99.6°F | Resp 20 | Ht 60.0 in | Wt 132.0 lb

## 2019-06-25 DIAGNOSIS — Z0000 Encounter for general adult medical examination without abnormal findings: Secondary | ICD-10-CM | POA: Diagnosis not present

## 2019-06-25 NOTE — Patient Instructions (Signed)
Exercising to Stay Healthy To become healthy and stay healthy, it is recommended that you do moderate-intensity and vigorous-intensity exercise. You can tell that you are exercising at a moderate intensity if your heart starts beating faster and you start breathing faster but can still hold a conversation. You can tell that you are exercising at a vigorous intensity if you are breathing much harder and faster and cannot hold a conversation while exercising. Exercising regularly is important. It has many health benefits, such as:  Improving overall fitness, flexibility, and endurance.  Increasing bone density.  Helping with weight control.  Decreasing body fat.  Increasing muscle strength.  Reducing stress and tension.  Improving overall health. How often should I exercise? Choose an activity that you enjoy, and set realistic goals. Your health care provider can help you make an activity plan that works for you. Exercise regularly as told by your health care provider. This may include:  Doing strength training two times a week, such as: ? Lifting weights. ? Using resistance bands. ? Push-ups. ? Sit-ups. ? Yoga.  Doing a certain intensity of exercise for a given amount of time. Choose from these options: ? A total of 150 minutes of moderate-intensity exercise every week. ? A total of 75 minutes of vigorous-intensity exercise every week. ? A mix of moderate-intensity and vigorous-intensity exercise every week. Children, pregnant women, people who have not exercised regularly, people who are overweight, and older adults may need to talk with a health care provider about what activities are safe to do. If you have a medical condition, be sure to talk with your health care provider before you start a new exercise program. What are some exercise ideas? Moderate-intensity exercise ideas include:  Walking 1 mile (1.6 km) in about 15 minutes.  Biking.  Hiking.  Golfing.  Dancing.   Water aerobics. Vigorous-intensity exercise ideas include:  Walking 4.5 miles (7.2 km) or more in about 1 hour.  Jogging or running 5 miles (8 km) in about 1 hour.  Biking 10 miles (16.1 km) or more in about 1 hour.  Lap swimming.  Roller-skating or in-line skating.  Cross-country skiing.  Vigorous competitive sports, such as football, basketball, and soccer.  Jumping rope.  Aerobic dancing. What are some everyday activities that can help me to get exercise?  Yard work, such as: ? Pushing a lawn mower. ? Raking and bagging leaves.  Washing your car.  Pushing a stroller.  Shoveling snow.  Gardening.  Washing windows or floors. How can I be more active in my day-to-day activities?  Use stairs instead of an elevator.  Take a walk during your lunch break.  If you drive, park your car farther away from your work or school.  If you take public transportation, get off one stop early and walk the rest of the way.  Stand up or walk around during all of your indoor phone calls.  Get up, stretch, and walk around every 30 minutes throughout the day.  Enjoy exercise with a friend. Support to continue exercising will help you keep a regular routine of activity. What guidelines can I follow while exercising?  Before you start a new exercise program, talk with your health care provider.  Do not exercise so much that you hurt yourself, feel dizzy, or get very short of breath.  Wear comfortable clothes and wear shoes with good support.  Drink plenty of water while you exercise to prevent dehydration or heat stroke.  Work out until your breathing   and your heartbeat get faster. Where to find more information  U.S. Department of Health and Human Services: www.hhs.gov  Centers for Disease Control and Prevention (CDC): www.cdc.gov Summary  Exercising regularly is important. It will improve your overall fitness, flexibility, and endurance.  Regular exercise also will  improve your overall health. It can help you control your weight, reduce stress, and improve your bone density.  Do not exercise so much that you hurt yourself, feel dizzy, or get very short of breath.  Before you start a new exercise program, talk with your health care provider. This information is not intended to replace advice given to you by your health care provider. Make sure you discuss any questions you have with your health care provider. Document Released: 08/27/2010 Document Revised: 07/07/2017 Document Reviewed: 06/15/2017 Elsevier Patient Education  2020 Elsevier Inc.  

## 2019-06-25 NOTE — Progress Notes (Signed)
 Subjective:    Patient ID: Darlene Adkins, female    DOB: 12/11/1995, 23 y.o.   MRN: [id number]   Chief Complaint: Annual Exam (No pap  Feels shaky in the AM at times)    HPI:  1. Annual physical exam Patient comes in today for annual physical exam. She currently has no medical problems and is on no meds. She has a history of depression in which she use to take zoloft for. She stopped taking it several years ago and is doing well.  Depression screen PHQ 2/9 06/25/2019 06/06/2018 01/04/2018  Decreased Interest 0 0 0  Down, Depressed, Hopeless 0 0 0  PHQ - 2 Score 0 0 0  Altered sleeping - - -  Tired, decreased energy - - -  Change in appetite - - -  Feeling bad or failure about yourself  - - -  Trouble concentrating - - -  Moving slowly or fidgety/restless - - -  Suicidal thoughts - - -  PHQ-9 Score - - -       Outpatient Encounter Medications as of 06/25/2019  Medication Sig  . etonogestrel (NEXPLANON) 68 MG IMPL implant 1 each by Subdermal route once.     Past Surgical History:  Procedure Laterality Date  . NO PAST SURGERIES      Family History  Problem Relation Age of Onset  . Anorexia nervosa Sister     New complaints: None today  Social history:  Controlled substance contract: n/a    Review of Systems  Constitutional: Negative for activity change and appetite change.  HENT: Negative.   Eyes: Negative for pain.  Respiratory: Negative for shortness of breath.   Cardiovascular: Negative for chest pain, palpitations and leg swelling.  Gastrointestinal: Negative for abdominal pain.  Endocrine: Negative for polydipsia.  Genitourinary: Negative.   Skin: Negative for rash.  Neurological: Negative for dizziness, weakness and headaches.  Hematological: Does not bruise/bleed easily.  Psychiatric/Behavioral: Negative.   All other systems reviewed and are negative.      Objective:   Physical Exam Vitals signs and nursing note reviewed.   Constitutional:      General: She is not in acute distress.    Appearance: Normal appearance. She is well-developed.  HENT:     Head: Normocephalic.     Nose: Nose normal.  Eyes:     Pupils: Pupils are equal, round, and reactive to light.  Neck:     Musculoskeletal: Normal range of motion and neck supple.     Vascular: No carotid bruit or JVD.  Cardiovascular:     Rate and Rhythm: Normal rate and regular rhythm.     Heart sounds: Normal heart sounds.  Pulmonary:     Effort: Pulmonary effort is normal. No respiratory distress.     Breath sounds: Normal breath sounds. No wheezing or rales.  Chest:     Chest wall: No tenderness.  Abdominal:     General: Bowel sounds are normal. There is no distension or abdominal bruit.     Palpations: Abdomen is soft. There is no hepatomegaly, splenomegaly, mass or pulsatile mass.     Tenderness: There is no abdominal tenderness.  Musculoskeletal: Normal range of motion.  Lymphadenopathy:     Cervical: No cervical adenopathy.  Skin:    General: Skin is warm and dry.  Neurological:     Mental Status: She is alert and oriented to person, place, and time.     Deep Tendon Reflexes: Reflexes are normal and symmetric.    Psychiatric:        Behavior: Behavior normal.        Thought Content: Thought content normal.        Judgment: Judgment normal.    BP 104/64   Pulse 66   Temp 99.6 F (37.6 C) (Temporal)   Resp 20   Ht 5' (1.524 m)   Wt 132 lb (59.9 kg)   SpO2 100%   BMI 25.78 kg/m        Assessment & Plan:  Darlene Adkins comes in today with chief complaint of Annual Exam (No pap  Feels shaky in the AM at times)   Diagnosis and orders addressed:  1. Annual physical exam Diet and exercise handout given - CBC with Differential/Platelet - CMP14+EGFR   Labs pending Health Maintenance reviewed Diet and exercise encouraged  Follow up plan: 1 year   [place], FNP

## 2019-06-26 LAB — CBC WITH DIFFERENTIAL/PLATELET
Basophils Absolute: 0 10*3/uL (ref 0.0–0.2)
Basos: 0 %
EOS (ABSOLUTE): 0 10*3/uL (ref 0.0–0.4)
Eos: 1 %
Hematocrit: 38.4 % (ref 34.0–46.6)
Hemoglobin: 12.6 g/dL (ref 11.1–15.9)
Immature Grans (Abs): 0 10*3/uL (ref 0.0–0.1)
Immature Granulocytes: 0 %
Lymphocytes Absolute: 1.6 10*3/uL (ref 0.7–3.1)
Lymphs: 22 %
MCH: 29.9 pg (ref 26.6–33.0)
MCHC: 32.8 g/dL (ref 31.5–35.7)
MCV: 91 fL (ref 79–97)
Monocytes Absolute: 0.5 10*3/uL (ref 0.1–0.9)
Monocytes: 6 %
Neutrophils Absolute: 5.3 10*3/uL (ref 1.4–7.0)
Neutrophils: 71 %
Platelets: 226 10*3/uL (ref 150–450)
RBC: 4.22 x10E6/uL (ref 3.77–5.28)
RDW: 12 % (ref 11.7–15.4)
WBC: 7.5 10*3/uL (ref 3.4–10.8)

## 2019-06-26 LAB — CMP14+EGFR
ALT: 7 IU/L (ref 0–32)
AST: 13 IU/L (ref 0–40)
Albumin/Globulin Ratio: 1.5 (ref 1.2–2.2)
Albumin: 4 g/dL (ref 3.9–5.0)
Alkaline Phosphatase: 93 IU/L (ref 39–117)
BUN/Creatinine Ratio: 13 (ref 9–23)
BUN: 8 mg/dL (ref 6–20)
Bilirubin Total: 0.3 mg/dL (ref 0.0–1.2)
CO2: 23 mmol/L (ref 20–29)
Calcium: 9.1 mg/dL (ref 8.7–10.2)
Chloride: 106 mmol/L (ref 96–106)
Creatinine, Ser: 0.61 mg/dL (ref 0.57–1.00)
GFR calc Af Amer: 149 mL/min/{1.73_m2} (ref >59)
GFR calc non Af Amer: 129 mL/min/{1.73_m2} (ref >59)
Globulin, Total: 2.7 g/dL (ref 1.5–4.5)
Glucose: 73 mg/dL (ref 65–99)
Potassium: 4.5 mmol/L (ref 3.5–5.2)
Sodium: 141 mmol/L (ref 134–144)
Total Protein: 6.7 g/dL (ref 6.0–8.5)

## 2021-07-26 ENCOUNTER — Other Ambulatory Visit: Payer: Self-pay

## 2021-07-26 ENCOUNTER — Ambulatory Visit (INDEPENDENT_AMBULATORY_CARE_PROVIDER_SITE_OTHER): Payer: Self-pay | Admitting: Adult Health

## 2021-07-26 ENCOUNTER — Encounter: Payer: Self-pay | Admitting: Adult Health

## 2021-07-26 VITALS — BP 87/60 | HR 79 | Ht 60.0 in | Wt 142.5 lb

## 2021-07-26 DIAGNOSIS — Z3046 Encounter for surveillance of implantable subdermal contraceptive: Secondary | ICD-10-CM | POA: Insufficient documentation

## 2021-07-26 NOTE — Progress Notes (Signed)
°  Subjective:     Patient ID: Darlene Adkins, female   DOB: [birth year], 25 y.o.   MRN: [id number]  HPI Darlene Adkins is a 25 year old Hispanic female,single, G1P1, in of rnexplanon removal, she is self pay.   Lab Results  Component Value Date   DIAGPAP  06/06/2018    NEGATIVE FOR INTRAEPITHELIAL LESIONS OR MALIGNANCY.    Review of Systems For nexplanon removal    Reviewed past medical,surgical, social and family history. Reviewed medications and allergies.  Objective:   Physical Exam BP (!) 87/60 (BP Location: Left Arm, Patient Position: Sitting, Cuff Size: Normal)    Pulse 79    Ht 5' (1.524 m)    Wt 142 lb 8 oz (64.6 kg)    BMI 27.83 kg/m  Consent signed, time out called. Left arm cleansed with betadine, and injected with 1.5 cc 2% lidocaine and waited til numb.Under sterile technique a #11 blade was used to make small vertical incision, and a curved forceps was used to easily remove rod. Steri strips applied. Pressure dressing applied.    Fall risk is low  Upstream - 07/26/21 1133       Pregnancy Intention Screening   Does the patient want to become pregnant in the next year? No    Does the patient's partner want to become pregnant in the next year? No    Would the patient like to discuss contraceptive options today? No      Contraception Wrap Up   Current Method Female Condom;Hormonal Implant    End Method Female Condom             Assessment:     1. Encounter for Nexplanon removal Use condoms, keep clean and dry x 24 hours, no heavy lifting, keep steri strips on x 72 hours, Keep pressure dressing on x 24 hours. Follow up prn problems.     Plan:      Try to get Family Planning Medicaid Pap and physical in 4 weeks

## 2021-07-26 NOTE — Patient Instructions (Addendum)
Use condoms,  keep clean and dry x 24 hours, no heavy lifting, keep steri strips on x 72 hours, Keep pressure dressing on x 24 hours. Follow up prn problems. Pap and physical in 4 weeks
